# Patient Record
Sex: Female | Born: 1950 | Race: White | Hispanic: No | Marital: Married | Smoking: Never smoker
Health system: Southern US, Community
[De-identification: ages and names within clinical notes are randomized; demographics above are authoritative.]

## PROBLEM LIST (undated history)

## (undated) DIAGNOSIS — E079 Disorder of thyroid, unspecified: Secondary | ICD-10-CM

## (undated) DIAGNOSIS — I519 Heart disease, unspecified: Secondary | ICD-10-CM

## (undated) DIAGNOSIS — I10 Essential (primary) hypertension: Secondary | ICD-10-CM

## (undated) HISTORY — PX: CARDIAC SURGERY: SHX584

## (undated) HISTORY — PX: PACEMAKER IMPLANT: EP1218

---

## 2020-11-13 ENCOUNTER — Encounter (HOSPITAL_COMMUNITY): Payer: Self-pay | Admitting: *Deleted

## 2020-11-13 ENCOUNTER — Emergency Department (HOSPITAL_COMMUNITY): Payer: Medicare Other

## 2020-11-13 ENCOUNTER — Other Ambulatory Visit: Payer: Self-pay

## 2020-11-13 ENCOUNTER — Emergency Department (HOSPITAL_COMMUNITY)
Admission: EM | Admit: 2020-11-13 | Discharge: 2020-11-13 | Disposition: A | Discharge status: Home / Self Care | Payer: Medicare Other | Attending: Emergency Medicine | Admitting: Emergency Medicine

## 2020-11-13 DIAGNOSIS — M545 Low back pain, unspecified: Secondary | ICD-10-CM | POA: Insufficient documentation

## 2020-11-13 DIAGNOSIS — I10 Essential (primary) hypertension: Secondary | ICD-10-CM | POA: Insufficient documentation

## 2020-11-13 DIAGNOSIS — M25551 Pain in right hip: Secondary | ICD-10-CM | POA: Insufficient documentation

## 2020-11-13 HISTORY — DX: Disorder of thyroid, unspecified: E07.9

## 2020-11-13 HISTORY — DX: Heart disease, unspecified: I51.9

## 2020-11-13 HISTORY — DX: Essential (primary) hypertension: I10

## 2020-11-13 NOTE — ED Triage Notes (Signed)
Pain in right hip for 3 days, taking tylenol with some relief

## 2020-11-13 NOTE — Discharge Instructions (Addendum)
You have been seen here for back pain I recommend taking over-the-counter pain medications like ibuprofen and/or Tylenol every 6 as needed.  Please follow dosage and on the back of bottle.  I also recommend applying heat to the area and stretching out the muscles as this will help decrease stiffness and pain.  I have given you information on exercises please follow.   Please have your primary care provider for further evaluation.  If you develop severe back pain, numbness or tingling in your legs, lose control of your bowels and/or your urine, unable to walk please come back to for further evaluation.

## 2020-11-13 NOTE — ED Provider Notes (Signed)
[facility] EMERGENCY DEPARTMENT Provider Note   CSN: [id number] Arrival date & time: 11/13/20  1519     History Chief Complaint  Patient presents with   Hip Pain    Kristi Rose is a 70 y.o. female.  HPI  Patient with significant medical history of heart disease, hypertension, thyroid disease presents to the Emergency Department with chief complaint of lower back pain.  Patient states back pain started about 3 days ago, started while she was working on the garden she bent down and felt pain in her back.  States the pain does not radiate, denies paresthesia or weakness in her lower extremities, denies urinary incontinency, retention, difficulty with bowel movements.  States  the pain was worse with movement especially with bending her back or move her legs.  She has been taking Tylenol which has improved her pain she currently has no pain this time.  She denies recent trauma to her back, is not on anticoagulant, denies history of IV drug use, denies associated fevers or chills denies any urinary symptoms.  She has no other complaints at this time.  Past Medical History:  Diagnosis Date   Heart disease    Hypertension    Thyroid disease     There are no problems to display for this patient.     OB History   No obstetric history on file.     No family history on file.  Social History   Tobacco Use   Smoking status: Never   Smokeless tobacco: Never  Substance Use Topics   Alcohol use: Not Currently   Drug use: Not Currently    Home Medications Prior to Admission medications   Not on File    Allergies    Iodine  Review of Systems   Review of Systems  Constitutional:  Negative for chills and fever.  HENT:  Negative for congestion.   Respiratory:  Negative for shortness of breath.   Cardiovascular:  Negative for chest pain.  Gastrointestinal:  Negative for abdominal pain.  Genitourinary:  Negative for difficulty urinating, enuresis and flank pain.   Musculoskeletal:  Positive for back pain.  Skin:  Negative for rash.  Neurological:  Negative for dizziness.  Hematological:  Does not bruise/bleed easily.   Physical Exam Updated Vital Signs BP 139/87 (BP Location: Right Arm)   Pulse 63   Temp 98 F (36.7 C) (Oral)   Resp 14   Ht 5\' 3"  (1.6 m)   Wt 67.6 kg   SpO2 97%   BMI 26.39 kg/m   Physical Exam Vitals and nursing note reviewed.  Constitutional:      General: She is not in acute distress.    Appearance: She is not ill-appearing.  HENT:     Head: Normocephalic and atraumatic.     Nose: No congestion.  Eyes:     Conjunctiva/sclera: Conjunctivae normal.  Cardiovascular:     Rate and Rhythm: Normal rate and regular rhythm.     Pulses: Normal pulses.     Heart sounds: No murmur heard.   No friction rub. No gallop.  Pulmonary:     Effort: No respiratory distress.     Breath sounds: No wheezing, rhonchi or rales.  Musculoskeletal:     Comments: Spine was palpated she has slight tenderness to palpation along her lumbar spine, no step-off deformities present.  Patient has full range of motion, 5 of 5 strength neurovascular tact in lower extremities.  Patient is able to ambulate without difficulty.  Skin:  General: Skin is warm and dry.  Neurological:     Mental Status: She is alert.  Psychiatric:        Mood and Affect: Mood normal.    ED Results / Procedures / Treatments   Labs (all labs ordered are listed, but only abnormal results are displayed) Labs Reviewed - No data to display  EKG None  Radiology DG Hip Unilat  With Pelvis 2-3 Views Right  Result Date: 11/13/2020 CLINICAL DATA:  Hip pain.  No injury. EXAM: DG HIP (WITH OR WITHOUT PELVIS) 2-3V RIGHT COMPARISON:  None. FINDINGS: The bones are diffusely osteopenic. No focal osseous lesions are identified. There is no evidence for acute fracture or dislocation. There are mild degenerative changes of both hips with joint space narrowing and osteophyte  formation. Sacroiliac joints are within normal limits. There are degenerative changes of the pubic symphysis. There are calcifications in the left hemipelvis which are indeterminate, possibly vascular. There are atherosclerotic calcifications of the iliac arteries. IMPRESSION: 1. No acute fracture or dislocation. 2. Mild degenerative changes as above. Electronically Signed   By: Darliss Cheney M.D.   On: 11/13/2020 17:10    Procedures Procedures   Medications Ordered in ED Medications - No data to display  ED Course  I have reviewed the triage vital signs and the nursing notes.  Pertinent labs & imaging results that were available during my care of the patient were reviewed by me and considered in my medical decision making (see chart for details).    MDM Rules/Calculators/A&P                          Initial impression-patient presents with lower back pain.  Which is since resolved she is alert, does not appear in acute stress, vital signs reassuring.  Triage obtained imaging of her right hip.  Work-up-DG of right hip is negative for acute findings.  Reassessment-after assessing the patient's I recommend that we could do CT lumbar spine for rule out of a compression fracture both patient and daughter were in agreement that they would like to defer on this as her pain has improved . We will have her follow-up with her PCP for further evaluation if needed.  Rule out- I have low suspicion for spinal fracture or spinal cord abnormality as patient denies urinary incontinency, retention, difficulty with bowel movements, denies saddle paresthesias.  Spine was palpated there is no step-off, crepitus or gross deformities felt, patient had 5/5 strength, full range of motion, neurovascular fully intact in the lower extremities . Low suspicion for septic arthritis as patient denies IV drug use, skin exam was performed no erythematous, edema or warm joints noted.  I have low suspicion for UTI,  pyelonephritis, kidney stone patient denies urinary symptoms, no CVA tenderness present my exam.  I have low suspicion for AAA, aortic dissection as presentation atypical of etiology.   Plan-  Back pain-likely muscular in nature but cannot fully exclude compression fracture will recommend over-the-counter pain medications, stretching of the back.  Follow-up with PCP as needed.  Gave her strict return precautions.  Vital signs have remained stable, no indication for hospital admission. Patient given at home care as well strict return precautions.  Patient verbalized that they understood agreed to said plan.  Final Clinical Impression(s) / ED Diagnoses Final diagnoses:  Acute midline low back pain without sciatica    Rx / DC Orders ED Discharge Orders     None  Carroll Sage, PA-C 11/13/20 2234    Derwood Kaplan, MD 11/14/20 1135

## 2021-01-02 DEATH — deceased

## 2022-09-28 IMAGING — DX DG HIP (WITH OR WITHOUT PELVIS) 2-3V*R*
3 series · 3 of 3 positions shown · non-contrast
Comparison: None.

CLINICAL DATA: Hip pain.  No injury.

EXAM:
DG HIP (WITH OR WITHOUT PELVIS) 2-3V RIGHT

[pelvis ap]
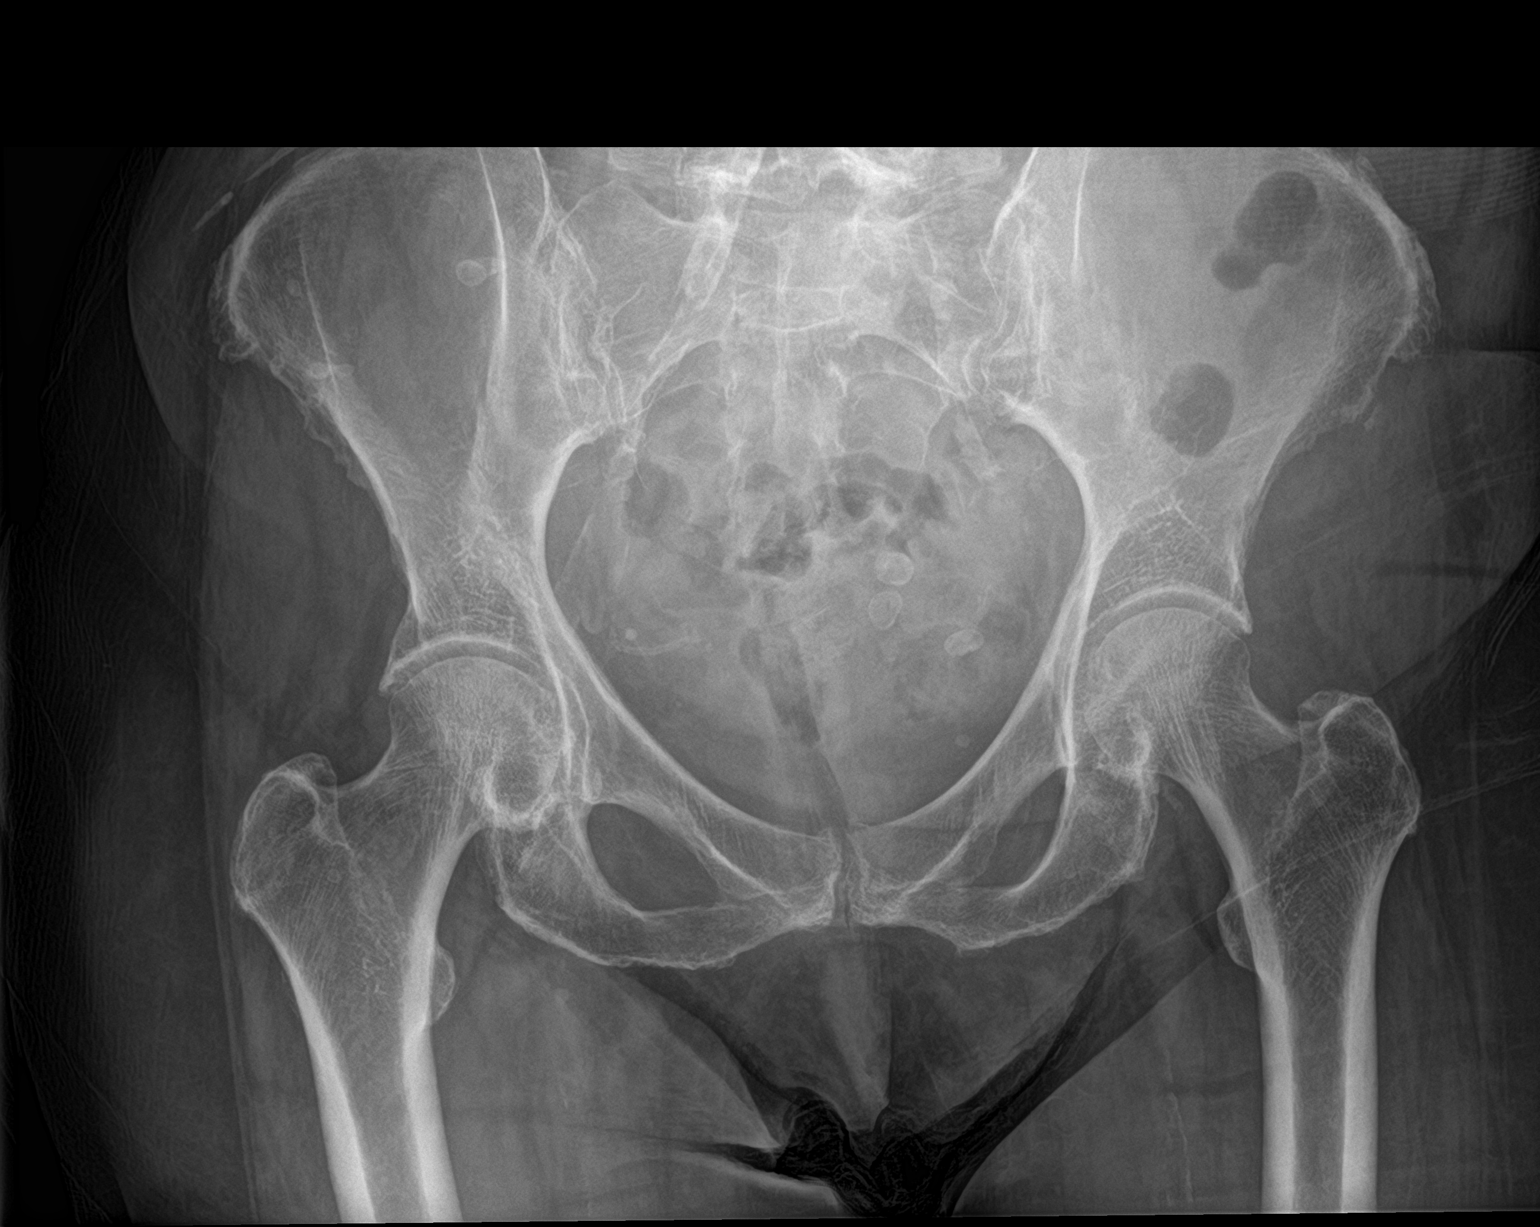

[hip ap]
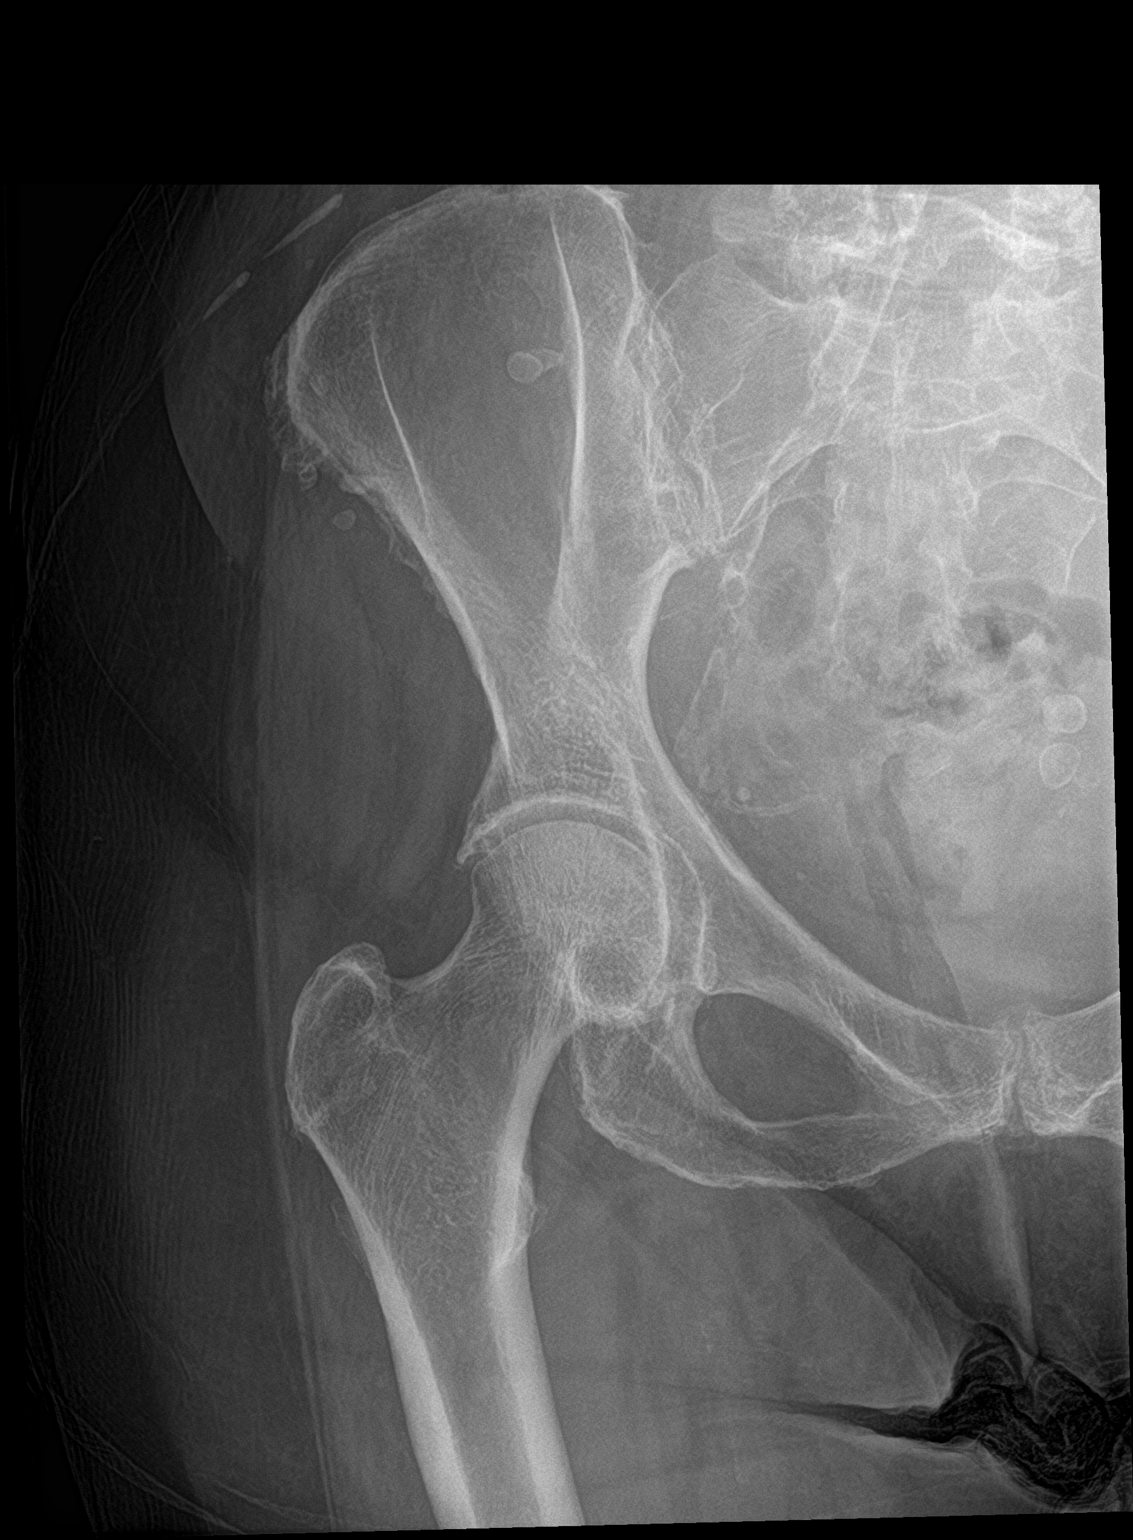

[hip lat]
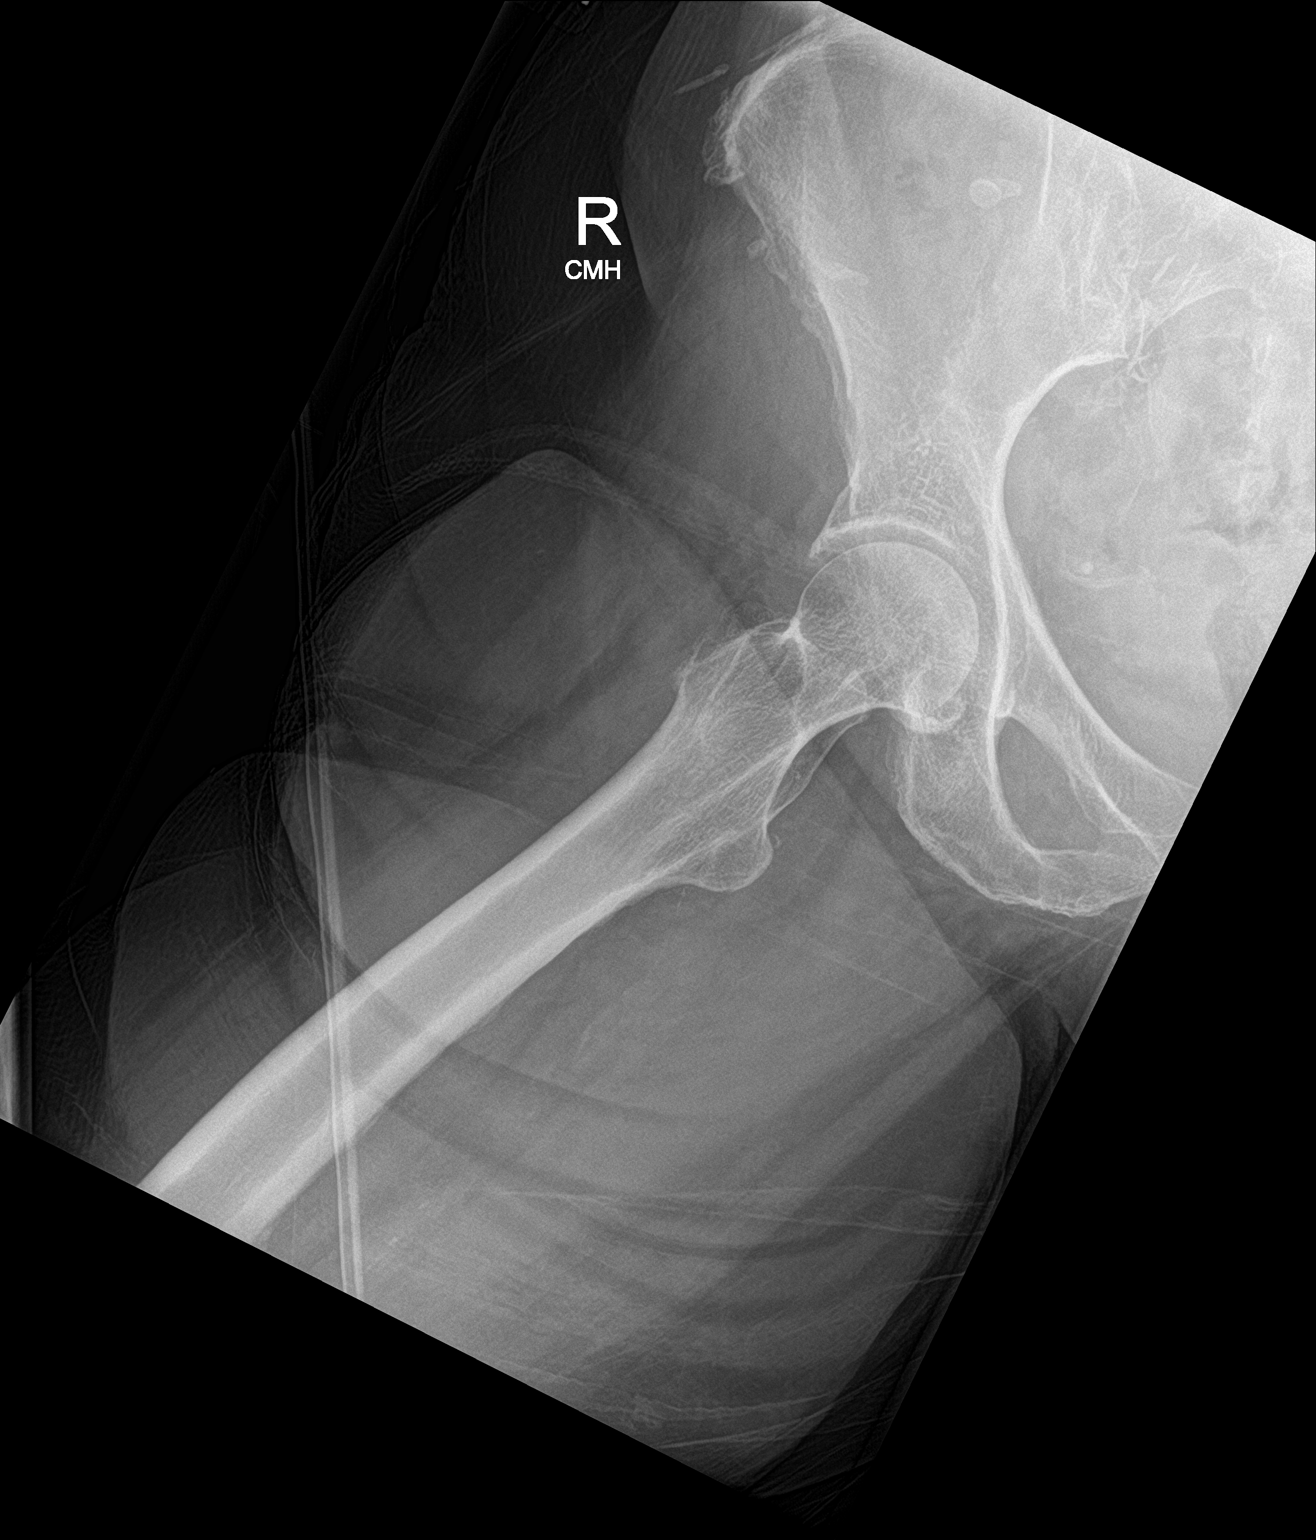

[3 of 3 positions shown; findings below may reference images not displayed]

FINDINGS: The bones are diffusely osteopenic. No focal osseous lesions are
identified. There is no evidence for acute fracture or dislocation.
There are mild degenerative changes of both hips with joint space
narrowing and osteophyte formation. Sacroiliac joints are within
normal limits. There are degenerative changes of the pubic
symphysis. There are calcifications in the left hemipelvis which are
indeterminate, possibly vascular. There are atherosclerotic
calcifications of the iliac arteries.
IMPRESSION: 1. No acute fracture or dislocation.
2. Mild degenerative changes as above.
